# Patient Record
Sex: Male | Born: 1970 | Race: White | Hispanic: No | Marital: Single | Smoking: Never smoker
Health system: Southern US, Community
[De-identification: ages and names within clinical notes are randomized; demographics above are authoritative.]

## PROBLEM LIST (undated history)

## (undated) DIAGNOSIS — F4310 Post-traumatic stress disorder, unspecified: Secondary | ICD-10-CM

## (undated) DIAGNOSIS — F419 Anxiety disorder, unspecified: Secondary | ICD-10-CM

## (undated) DIAGNOSIS — I10 Essential (primary) hypertension: Secondary | ICD-10-CM

## (undated) DIAGNOSIS — E785 Hyperlipidemia, unspecified: Secondary | ICD-10-CM

## (undated) HISTORY — PX: ANKLE SURGERY: SHX546

## (undated) HISTORY — PX: SHOULDER SURGERY: SHX246

---

## 2014-05-12 ENCOUNTER — Emergency Department (HOSPITAL_COMMUNITY): Payer: No Typology Code available for payment source

## 2014-05-12 ENCOUNTER — Emergency Department (HOSPITAL_COMMUNITY)
Admission: EM | Admit: 2014-05-12 | Discharge: 2014-05-12 | Disposition: A | Discharge status: Home / Self Care | Payer: No Typology Code available for payment source | Attending: Emergency Medicine | Admitting: Emergency Medicine

## 2014-05-12 ENCOUNTER — Encounter (HOSPITAL_COMMUNITY): Payer: Self-pay | Admitting: Emergency Medicine

## 2014-05-12 DIAGNOSIS — T148XXA Other injury of unspecified body region, initial encounter: Secondary | ICD-10-CM

## 2014-05-12 DIAGNOSIS — Y92410 Unspecified street and highway as the place of occurrence of the external cause: Secondary | ICD-10-CM | POA: Insufficient documentation

## 2014-05-12 DIAGNOSIS — S40011A Contusion of right shoulder, initial encounter: Secondary | ICD-10-CM

## 2014-05-12 DIAGNOSIS — IMO0002 Reserved for concepts with insufficient information to code with codable children: Secondary | ICD-10-CM | POA: Insufficient documentation

## 2014-05-12 DIAGNOSIS — I10 Essential (primary) hypertension: Secondary | ICD-10-CM | POA: Insufficient documentation

## 2014-05-12 DIAGNOSIS — S40019A Contusion of unspecified shoulder, initial encounter: Secondary | ICD-10-CM | POA: Insufficient documentation

## 2014-05-12 DIAGNOSIS — Y9389 Activity, other specified: Secondary | ICD-10-CM | POA: Insufficient documentation

## 2014-05-12 HISTORY — DX: Essential (primary) hypertension: I10

## 2014-05-12 HISTORY — DX: Hyperlipidemia, unspecified: E78.5

## 2014-05-12 MED ORDER — HYDROCODONE-ACETAMINOPHEN 5-325 MG PO TABS
1.0000 | ORAL_TABLET | Freq: Once | ORAL | Status: AC
Start: 2014-05-12 — End: 2014-05-12
  Administered 2014-05-12: 1 via ORAL
  Filled 2014-05-12: qty 1

## 2014-05-12 MED ORDER — NAPROXEN 500 MG PO TABS: 500.0000 mg | ORAL_TABLET | Freq: Two times a day (BID) | ORAL | Status: DC
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
## 2014-05-12 NOTE — ED Notes (Signed)
Bed: [facility] Expected date:  Expected time:  Means of arrival:  Comments: EMS 
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
## 2014-05-12 NOTE — ED Notes (Signed)
PT has a ride home.  

## 2014-05-12 NOTE — ED Provider Notes (Signed)
Medical screening examination/treatment/procedure(s) were performed by non-physician practitioner and as supervising physician I was immediately available for consultation/collaboration.   EKG Interpretation None        Whitney Plunkett, MD 05/12/14 2329 

## 2014-05-12 NOTE — ED Notes (Signed)
Pt ambulatory to exam room with steady gait.  
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
## 2014-07-28 ENCOUNTER — Ambulatory Visit: Payer: Non-veteran care | Attending: Physician Assistant | Admitting: Physical Therapy

## 2014-07-28 DIAGNOSIS — M25619 Stiffness of unspecified shoulder, not elsewhere classified: Secondary | ICD-10-CM | POA: Insufficient documentation

## 2014-07-28 DIAGNOSIS — M25519 Pain in unspecified shoulder: Secondary | ICD-10-CM | POA: Diagnosis not present

## 2014-07-28 DIAGNOSIS — IMO0001 Reserved for inherently not codable concepts without codable children: Secondary | ICD-10-CM | POA: Diagnosis present

## 2014-08-10 ENCOUNTER — Ambulatory Visit: Payer: Non-veteran care | Admitting: Rehabilitation

## 2014-08-10 DIAGNOSIS — IMO0001 Reserved for inherently not codable concepts without codable children: Secondary | ICD-10-CM | POA: Diagnosis not present

## 2014-08-12 ENCOUNTER — Ambulatory Visit: Payer: Non-veteran care | Admitting: Physical Therapy

## 2014-08-12 DIAGNOSIS — IMO0001 Reserved for inherently not codable concepts without codable children: Secondary | ICD-10-CM | POA: Diagnosis not present

## 2014-08-17 ENCOUNTER — Ambulatory Visit: Payer: Non-veteran care | Admitting: Rehabilitation

## 2014-08-17 DIAGNOSIS — IMO0001 Reserved for inherently not codable concepts without codable children: Secondary | ICD-10-CM | POA: Diagnosis not present

## 2014-08-19 ENCOUNTER — Ambulatory Visit: Payer: Non-veteran care | Attending: Physician Assistant | Admitting: Physical Therapy

## 2014-08-19 DIAGNOSIS — M25511 Pain in right shoulder: Secondary | ICD-10-CM | POA: Insufficient documentation

## 2014-08-19 DIAGNOSIS — M25611 Stiffness of right shoulder, not elsewhere classified: Secondary | ICD-10-CM | POA: Insufficient documentation

## 2014-08-19 DIAGNOSIS — Z5189 Encounter for other specified aftercare: Secondary | ICD-10-CM | POA: Insufficient documentation

## 2014-08-24 ENCOUNTER — Ambulatory Visit: Payer: Non-veteran care | Admitting: Physical Therapy

## 2014-08-24 DIAGNOSIS — Z5189 Encounter for other specified aftercare: Secondary | ICD-10-CM | POA: Diagnosis not present

## 2014-08-26 ENCOUNTER — Encounter: Payer: Non-veteran care | Admitting: Physical Therapy

## 2014-08-27 ENCOUNTER — Encounter: Payer: Non-veteran care | Admitting: Physical Therapy

## 2014-08-31 ENCOUNTER — Ambulatory Visit: Payer: Non-veteran care | Admitting: Rehabilitation

## 2014-08-31 DIAGNOSIS — Z5189 Encounter for other specified aftercare: Secondary | ICD-10-CM | POA: Diagnosis not present

## 2014-09-07 ENCOUNTER — Ambulatory Visit: Payer: Non-veteran care | Admitting: Physical Therapy

## 2014-09-07 DIAGNOSIS — Z5189 Encounter for other specified aftercare: Secondary | ICD-10-CM | POA: Diagnosis not present

## 2014-09-15 ENCOUNTER — Ambulatory Visit: Payer: Non-veteran care

## 2014-09-15 DIAGNOSIS — Z5189 Encounter for other specified aftercare: Secondary | ICD-10-CM | POA: Diagnosis not present

## 2014-09-23 ENCOUNTER — Encounter: Payer: Non-veteran care | Admitting: Physical Therapy

## 2014-09-30 ENCOUNTER — Encounter: Payer: Non-veteran care | Admitting: Physical Therapy

## 2016-09-20 ENCOUNTER — Encounter (HOSPITAL_COMMUNITY): Payer: Self-pay | Admitting: Pharmacy Technician

## 2016-09-20 ENCOUNTER — Emergency Department (HOSPITAL_COMMUNITY): Payer: Non-veteran care

## 2016-09-20 ENCOUNTER — Emergency Department (HOSPITAL_COMMUNITY)
Admission: EM | Admit: 2016-09-20 | Discharge: 2016-09-20 | Disposition: A | Discharge status: Home / Self Care | Payer: Non-veteran care | Attending: Emergency Medicine | Admitting: Emergency Medicine

## 2016-09-20 DIAGNOSIS — I10 Essential (primary) hypertension: Secondary | ICD-10-CM | POA: Insufficient documentation

## 2016-09-20 DIAGNOSIS — Z79899 Other long term (current) drug therapy: Secondary | ICD-10-CM | POA: Diagnosis not present

## 2016-09-20 DIAGNOSIS — R519 Headache, unspecified: Secondary | ICD-10-CM

## 2016-09-20 DIAGNOSIS — R51 Headache: Secondary | ICD-10-CM | POA: Insufficient documentation

## 2016-09-20 HISTORY — DX: Anxiety disorder, unspecified: F41.9

## 2016-09-20 HISTORY — DX: Post-traumatic stress disorder, unspecified: F43.10

## 2016-09-20 LAB — COMPREHENSIVE METABOLIC PANEL
ALBUMIN: 4.2 g/dL (ref 3.5–5.0)
ALT: 36 U/L (ref 17–63)
ANION GAP: 6 (ref 5–15)
AST: 23 U/L (ref 15–41)
Alkaline Phosphatase: 77 U/L (ref 38–126)
BILIRUBIN TOTAL: 0.9 mg/dL (ref 0.3–1.2)
BUN: 16 mg/dL (ref 6–20)
CO2: 29 mmol/L (ref 22–32)
Calcium: 9.4 mg/dL (ref 8.9–10.3)
Chloride: 99 mmol/L — ABNORMAL LOW (ref 101–111)
Creatinine, Ser: 1.35 mg/dL — ABNORMAL HIGH (ref 0.61–1.24)
GFR calc Af Amer: >60 mL/min (ref >60)
Glucose, Bld: 112 mg/dL — ABNORMAL HIGH (ref 65–99)
POTASSIUM: 4.2 mmol/L (ref 3.5–5.1)
Sodium: 134 mmol/L — ABNORMAL LOW (ref 135–145)
TOTAL PROTEIN: 7.6 g/dL (ref 6.5–8.1)

## 2016-09-20 LAB — CBC WITH DIFFERENTIAL/PLATELET
BASOS PCT: 0 %
Basophils Absolute: 0 10*3/uL (ref 0.0–0.1)
Eosinophils Absolute: 0.1 10*3/uL (ref 0.0–0.7)
Eosinophils Relative: 1 %
HEMATOCRIT: 48.6 % (ref 39.0–52.0)
Hemoglobin: 16.6 g/dL (ref 13.0–17.0)
Lymphocytes Relative: 7 %
Lymphs Abs: 0.9 10*3/uL (ref 0.7–4.0)
MCH: 30.6 pg (ref 26.0–34.0)
MCHC: 34.2 g/dL (ref 30.0–36.0)
MCV: 89.5 fL (ref 78.0–100.0)
MONO ABS: 1 10*3/uL (ref 0.1–1.0)
MONOS PCT: 8 %
NEUTROS ABS: 9.8 10*3/uL — AB (ref 1.7–7.7)
Neutrophils Relative %: 84 %
Platelets: 259 10*3/uL (ref 150–400)
RBC: 5.43 MIL/uL (ref 4.22–5.81)
RDW: 13.1 % (ref 11.5–15.5)
WBC: 11.8 10*3/uL — ABNORMAL HIGH (ref 4.0–10.5)

## 2016-09-20 LAB — I-STAT TROPONIN, ED: TROPONIN I, POC: 0 ng/mL (ref 0.00–0.08)

## 2016-09-20 MED ORDER — DIPHENHYDRAMINE HCL 50 MG/ML IJ SOLN
25.0000 mg | Freq: Once | INTRAMUSCULAR | Status: AC
  Administered 2016-09-20: 25 mg via INTRAVENOUS
  Filled 2016-09-20: qty 1

## 2016-09-20 MED ORDER — SODIUM CHLORIDE 0.9 % IV BOLUS (SEPSIS)
1000.0000 mL | Freq: Once | INTRAVENOUS | Status: AC
Start: 2016-09-20 — End: 2016-09-20
  Administered 2016-09-20: 1000 mL via INTRAVENOUS

## 2016-09-20 MED ORDER — KETOROLAC TROMETHAMINE 30 MG/ML IJ SOLN
30.0000 mg | Freq: Once | INTRAMUSCULAR | Status: AC
  Administered 2016-09-20: 30 mg via INTRAVENOUS
  Filled 2016-09-20: qty 1

## 2016-09-20 MED ORDER — METOCLOPRAMIDE HCL 5 MG/ML IJ SOLN
10.0000 mg | Freq: Once | INTRAMUSCULAR | Status: AC
  Administered 2016-09-20: 10 mg via INTRAVENOUS
  Filled 2016-09-20: qty 2
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
## 2016-09-20 NOTE — ED Triage Notes (Signed)
Per EMS pt C/O headache since approx 0800 today with sensitivity to light, nausea, dizziness and near syncope. Pt with no hx of migraines. Hx includes hypertension, hyperlipidemia, PTSD and anxiety.

## 2016-09-20 NOTE — ED Notes (Signed)
Patient transported to CT 

## 2016-09-20 NOTE — Discharge Instructions (Signed)
Drink plenty of fluids. Avoid stressors. Your creatinine slightly high today at 1.35. Otherwise no significant lab abnormalities. CT scan is normal. Take ibuprofen or tylenol for pain. Follow up with family doctor.

## 2017-11-27 ENCOUNTER — Encounter: Payer: Self-pay | Admitting: Podiatry

## 2017-11-27 ENCOUNTER — Ambulatory Visit (INDEPENDENT_AMBULATORY_CARE_PROVIDER_SITE_OTHER): Payer: Non-veteran care | Admitting: Podiatry

## 2017-11-27 VITALS — BP 135/101 | HR 93 | Ht 71.0 in | Wt 342.0 lb

## 2017-11-27 DIAGNOSIS — L600 Ingrowing nail: Secondary | ICD-10-CM

## 2017-11-27 NOTE — Patient Instructions (Signed)
Place 1/4 cup of epsom salts in a quart of warm tap water.  Submerge your foot or feet in the solution and soak for 20 minutes.  This soak should be done twice a day.  Next, remove your foot or feet from solution, blot dry the affected area. Apply ointment and cover if instructed by your doctor.   IF YOUR SKIN BECOMES IRRITATED WHILE USING THESE INSTRUCTIONS, IT IS OKAY TO SWITCH TO  WHITE VINEGAR AND WATER.  As another alternative soak, you may use antibacterial soap and water.  Monitor for any signs/symptoms of infection. Call the office immediately if any occur or go directly to the emergency room. Call with any questions/concerns.  Ingrown Toenail An ingrown toenail occurs when the corner or sides of your toenail grow into the surrounding skin. The big toe is most commonly affected, but it can happen to any of your toes. If your ingrown toenail is not treated, you will be at risk for infection. What are the causes? This condition may be caused by:  Wearing shoes that are too small or tight.  Injury or trauma, such as stubbing your toe or having your toe stepped on.  Improper cutting or care of your toenails.  Being born with (congenital) nail or foot abnormalities, such as having a nail that is too big for your toe.  What increases the risk? Risk factors for an ingrown toenail include:  Age. Your nails tend to thicken as you get older, so ingrown nails are more common in older people.  Diabetes.  Cutting your toenails incorrectly.  Blood circulation problems.  What are the signs or symptoms? Symptoms may include:  Pain, soreness, or tenderness.  Redness.  Swelling.  Hardening of the skin surrounding the toe.  Your ingrown toenail may be infected if there is fluid, pus, or drainage. How is this diagnosed? An ingrown toenail may be diagnosed by medical history and physical exam. If your toenail is infected, your health care provider may test a sample of the  drainage. How is this treated? Treatment depends on the severity of your ingrown toenail. Some ingrown toenails may be treated at home. More severe or infected ingrown toenails may require surgery to remove all or part of the nail. Infected ingrown toenails may also be treated with antibiotic medicines. Follow these instructions at home:  If you were prescribed an antibiotic medicine, finish all of it even if you start to feel better.  Soak your foot in warm soapy water for 20 minutes, 3 times per day or as directed by your health care provider.  Carefully lift the edge of the nail away from the sore skin by wedging a small piece of cotton under the corner of the nail. This may help with the pain. Be careful not to cause more injury to the area.  Wear shoes that fit well. If your ingrown toenail is causing you pain, try wearing sandals, if possible.  Trim your toenails regularly and carefully. Do not cut them in a curved shape. Cut your toenails straight across. This prevents injury to the skin at the corners of the toenail.  Keep your feet clean and dry.  If you are having trouble walking and are given crutches by your health care provider, use them as directed.  Do not pick at your toenail or try to remove it yourself.  Take medicines only as directed by your health care provider.  Keep all follow-up visits as directed by your health care provider. This   is important. Contact a health care provider if:  Your symptoms do not improve with treatment. Get help right away if:  You have red streaks that start at your foot and go up your leg.  You have a fever.  You have increased redness, swelling, or pain.  You have fluid, blood, or pus coming from your toenail. This information is not intended to replace advice given to you by your health care provider. Make sure you discuss any questions you have with your health care provider. Document Released: 11/02/2000 Document Revised:  04/06/2016 Document Reviewed: 09/29/2014 [employer] Interactive Patient Education  2018 [employer]  

## 2017-11-27 NOTE — Progress Notes (Signed)
   Subjective:    Patient ID: Brendan Barton, male    DOB: 04-19-1971, 47 y.o.   MRN: [id number]  HPI  Chief Complaint  Patient presents with  . Ingrown Toenail    left great toe  . Plantar Warts    right bottom of foot      Review of Systems  All other systems reviewed and are negative.      Objective:   Physical Exam        Assessment & Plan:

## 2017-11-29 ENCOUNTER — Telehealth: Payer: Self-pay | Admitting: Podiatry

## 2017-11-29 NOTE — Telephone Encounter (Signed)
This is Kathlene NovemberMike calling from the [facility]. I'm calling to get the office visit note from date of service 27 November 2017. Please fax to my attention at [phone].

## 2017-12-01 NOTE — Progress Notes (Signed)
   Subjective: Patient presents today for evaluation of pain to the medial border of the left great toe. Patient is concerned for possible ingrown nail. Patient states that the pain has been present for a few weeks now.  He also complains of a wart to the plantar aspect of the right foot.  Patient presents today for further treatment and evaluation.   Past Medical History:  Diagnosis Date  . Anxiety   . Hyperlipidemia   . Hypertension   . PTSD (post-traumatic stress disorder)     Objective:  General: Well developed, nourished, in no acute distress, alert and oriented x3   Dermatology: Skin is warm, dry and supple bilateral.  Medial border of the left great toe appears to be erythematous with evidence of an ingrowing nail. Pain on palpation noted to the border of the nail fold. The remaining nails appear unremarkable at this time. There are no open sores, lesions.  Vascular: Dorsalis Pedis artery and Posterior Tibial artery pedal pulses palpable. No lower extremity edema noted.   Neruologic: Grossly intact via light touch bilateral.  Musculoskeletal: Muscular strength within normal limits in all groups bilateral. Normal range of motion noted to all pedal and ankle joints.   Assesement: #1 Paronychia with ingrowing nail medial border of the left great toe #2 Pain in toe #3 Incurvated nail  Plan of Care:  1. Patient evaluated.  2. Discussed treatment alternatives and plan of care. Explained nail avulsion procedure and post procedure course to patient. 3. Patient opted for permanent partial nail avulsion.  4. Prior to procedure, local anesthesia infiltration utilized using 3 ml of a 50:50 mixture of 2% plain lidocaine and 0.5% plain marcaine in a normal hallux block fashion and a betadine prep performed.  5. Partial permanent nail avulsion with chemical matrixectomy performed using 3x30sec applications of phenol followed by alcohol flush.  6. Light dressing applied. 7. Return to  clinic in 2 weeks.  May need to address wart on right foot at that time.  Felecia ShellingBrent M. Gloriana Piltz, DPM [facility]  Dr. Felecia ShellingBrent M. Orine Goga, DPM    [address]                                        [place], [place] [address]                Office [phone](336) [phone]  Fax [phone]([phone]

## 2017-12-16 ENCOUNTER — Ambulatory Visit: Payer: Non-veteran care | Admitting: Podiatry

## 2017-12-18 ENCOUNTER — Ambulatory Visit (INDEPENDENT_AMBULATORY_CARE_PROVIDER_SITE_OTHER): Payer: Non-veteran care | Admitting: Podiatry

## 2017-12-18 ENCOUNTER — Encounter: Payer: Self-pay | Admitting: Podiatry

## 2017-12-18 DIAGNOSIS — B070 Plantar wart: Secondary | ICD-10-CM | POA: Diagnosis not present

## 2017-12-18 DIAGNOSIS — L600 Ingrowing nail: Secondary | ICD-10-CM | POA: Diagnosis not present

## 2017-12-22 NOTE — Progress Notes (Signed)
   Subjective: 47 year old male presenting today for follow up evaluation status post a permanent partial nail avulsion procedure of the medial border of the left great toe on 11/27/17. He states the area is doing well and has no complaints concerning it at this time.  He does report a plantar wart to the right foot that he is concerned about. He reports associated soreness of the area. He has not done anything to treat the area. Bearing weight and applying pressure increases the pain while resting the foot helps alleviate it. Patient denies trauma. Patient is here for further evaluation and treatment.   Past Medical History:  Diagnosis Date  . Anxiety   . Hyperlipidemia   . Hypertension   . PTSD (post-traumatic stress disorder)     Objective: Physical Exam General: The patient is alert and oriented x3 in no acute distress.  Dermatology: Hyperkeratotic skin lesion noted to the plantar aspect of the right foot approximately 1 cm in diameter. Pinpoint bleeding noted upon debridement. Nail and respective nail fold appears to be healing appropriately. Open wound to the associated nail fold with a granular wound base and moderate amount of fibrotic tissue. Minimal drainage noted. Mild erythema around the periungual region likely due to phenol chemical matricectomy. Skin is warm, dry and supple bilateral lower extremities. Negative for open lesions or macerations.  Vascular: Palpable pedal pulses bilaterally. No edema or erythema noted. Capillary refill within normal limits.  Neurological: Epicritic and protective threshold grossly intact bilaterally.   Musculoskeletal Exam: Pain on palpation to the note skin lesion.  Range of motion within normal limits to all pedal and ankle joints bilateral. Muscle strength 5/5 in all groups bilateral.   Assessment: #1 plantar wart right foot #2 pain in right foot #3 postop permanent partial nail avulsion medial border left great toe #4 open wound  periungual nail fold of respective digit.    Plan of Care:  #1 Patient was evaluated. #2 Excisional debridement of the plantar wart lesion was performed using a chisel blade. Cantharone was applied and the lesion was dressed with a dry sterile dressing. #3 Debridement of open wound was performed to the periungual border of the respective toe using a currette. Antibiotic ointment and Band-Aid was applied. #4 Return to clinic in 2 weeks.   Felecia ShellingBrent M. Minnie Shi, DPM [facility]  Dr. Felecia ShellingBrent M. Skarlet Lyons, DPM    [address]                                        [place], [place] [address]                Office [phone]([phone]  Fax [phone](336) [phone]

## 2018-01-06 ENCOUNTER — Ambulatory Visit (INDEPENDENT_AMBULATORY_CARE_PROVIDER_SITE_OTHER): Payer: Non-veteran care | Admitting: Podiatry

## 2018-01-06 ENCOUNTER — Encounter: Payer: Self-pay | Admitting: Podiatry

## 2018-01-06 DIAGNOSIS — B070 Plantar wart: Secondary | ICD-10-CM

## 2018-01-08 NOTE — Progress Notes (Signed)
   Subjective: Patient presents today for follow-up evaluation of a plantar wart to the right foot. He states the area has blistered but is no longer sore. Patient presents today for follow-up treatment and evaluation.   Past Medical History:  Diagnosis Date  . Anxiety   . Hyperlipidemia   . Hypertension   . PTSD (post-traumatic stress disorder)      Objective: Physical Exam General: The patient is alert and oriented x3 in no acute distress.  Dermatology: Hyperkeratotic skin lesion noted to the plantar aspect of the right foot approximately 1 cm in diameter. Pinpoint bleeding noted upon debridement. Skin is warm, dry and supple bilateral lower extremities. Negative for open lesions or macerations.  Vascular: Palpable pedal pulses bilaterally. No edema or erythema noted. Capillary refill within normal limits.  Neurological: Epicritic and protective threshold grossly intact bilaterally.   Musculoskeletal Exam: Pain on palpation to the note skin lesion.  Range of motion within normal limits to all pedal and ankle joints bilateral. Muscle strength 5/5 in all groups bilateral.   Assessment: #1 plantar wart right foot - improved   Plan of Care:  #1 Patient was evaluated. #2 Excisional debridement of the plantar wart lesion was performed using a chisel blade. Salicylic acid was applied and the lesion was dressed with a dry sterile dressing. #3 return to clinic when necessary.   Felecia ShellingBrent M. Trevin Gartrell, DPM [facility]  Dr. Felecia ShellingBrent M. Corban Kistler, DPM    [address]                                        [place], [place] [address]                Office [phone]  Fax [phone]) [phone]

## 2018-06-25 ENCOUNTER — Ambulatory Visit (INDEPENDENT_AMBULATORY_CARE_PROVIDER_SITE_OTHER): Payer: No Typology Code available for payment source | Admitting: Podiatry

## 2018-06-25 ENCOUNTER — Other Ambulatory Visit: Payer: Self-pay | Admitting: Podiatry

## 2018-06-25 ENCOUNTER — Ambulatory Visit (INDEPENDENT_AMBULATORY_CARE_PROVIDER_SITE_OTHER): Payer: No Typology Code available for payment source

## 2018-06-25 DIAGNOSIS — M779 Enthesopathy, unspecified: Principal | ICD-10-CM

## 2018-06-25 DIAGNOSIS — B070 Plantar wart: Secondary | ICD-10-CM

## 2018-06-25 DIAGNOSIS — M722 Plantar fascial fibromatosis: Secondary | ICD-10-CM

## 2018-06-25 DIAGNOSIS — L600 Ingrowing nail: Secondary | ICD-10-CM | POA: Diagnosis not present

## 2018-06-25 DIAGNOSIS — M778 Other enthesopathies, not elsewhere classified: Secondary | ICD-10-CM

## 2018-06-25 NOTE — Patient Instructions (Signed)
Place 1/4 cup of epsom salts in a quart of warm tap water.  Submerge your foot or feet in the solution and soak for 20 minutes.  This soak should be done twice a day.  Next, remove your foot or feet from solution, blot dry the affected area. Apply ointment and cover if instructed by your doctor.   IF YOUR SKIN BECOMES IRRITATED WHILE USING THESE INSTRUCTIONS, IT IS OKAY TO SWITCH TO  WHITE VINEGAR AND WATER.  As another alternative soak, you may use antibacterial soap and water.  Monitor for any signs/symptoms of infection. Call the office immediately if any occur or go directly to the emergency room. Call with any questions/concerns.   Long Term Care Instructions-Post Nail Surgery  You have had your ingrown toenail and root treated with a chemical.  This chemical causes a burn that will drain and ooze like a blister.  This can drain for 6-8 weeks or longer.  It is important to keep this area clean, covered, and follow the soaking instructions dispensed at the time of your surgery.  This area will eventually dry and form a scab.  Once the scab forms you no longer need to soak or apply a dressing.  If at any time you experience an increase in pain, redness, swelling, or drainage, you should contact the office as soon as possible.  Wart Surgery-Directions for Home Care  You will need: Dial antibacterial hand soap, sterile gauze,  Band-aids  1. Keep the original bandage on until the following morning.  Bathe or shower with the bandage on allowing it to soak, so that when removed it won't stick to the wound. 2. After showering or bathing, remove the old bandage and cleanse the area with Dial soap and water.  Place a few drops of Dial soap and water on a piece of guaze and gently scrub the area.  Dry with a clean piece of gauze. 3. Apply antibiotic cream (polysporin, triple antibiotic or similar) to the area and place a clean square gauze bandage over and cover with a band-aid. 4. In the evening, add a  few drops of Dial soap to a basin of lukewarm water and soak your foot for 15 minutes.  After soaking, follow the instructions above for cleaning the area. 5. Continue cleansing the area as described above two times a day, applying sterile gauze dressings until the doctor informs you that it is not needed. 6. The charge for the surgical procedure includes the follow-up visits after surgery.  Additional treatments (if necessary) are not included. 7. The time required to heal the surgical site will depend upon the size and location of the wart.  Lesions under bony prominences heal slower.  The average healing time is 2 to 4 weeks. 8. Take over the counter Ibuprofen or Tylenol as needed should you experience any discomfort 9. If you do experience discomfort after surgery, keep the foot elevated and apply an ice pack over your ankle, 30 minutes on, 30 minutes off each hour for the rest of the day. 10. If you have any questions , please do not hesitate to contact the office.

## 2018-06-29 NOTE — Progress Notes (Signed)
   Subjective: Patient presents today for evaluation of pain to the medial border of the right hallux that began a few weeks ago. Patient is concerned for possible ingrown nail. Wearing shoes and applying pressure to the toe increases the pain. He has not done anything for treatment.  He also reports a flare up of bilateral plantar fasciitis that began about one months ago. He states he is interested in orthotics. He has not done anything for treatment at this time. Walking and bearing weight for long periods of time increases the pain.  Lastly, patient complains of a recurrent plantar wart of the right foot. He has not done anything for treatment. He states bearing weight on the area increases the pain. Patient presents today for further treatment and evaluation.  Past Medical History:  Diagnosis Date  . Anxiety   . Hyperlipidemia   . Hypertension   . PTSD (post-traumatic stress disorder)     Objective:  General: Well developed, nourished, in no acute distress, alert and oriented x3   Dermatology: Skin is warm, dry and supple bilateral. Medial border of the right hallux appears to be erythematous with evidence of an ingrowing nail. Pain on palpation noted to the border of the nail fold. Hyperkeratotic skin lesion noted to the plantar aspect of the right foot approximately 1 cm in diameter. Pinpoint bleeding noted upon debridement. The remaining nails appear unremarkable at this time.   Vascular: Dorsalis Pedis artery and Posterior Tibial artery pedal pulses palpable. No lower extremity edema noted.   Neruologic: Grossly intact via light touch bilateral.  Musculoskeletal: Tenderness to palpation to the plantar aspect of the bilateral heels along the plantar fascia. Muscular strength within normal limits in all groups bilateral. Normal range of motion noted to all pedal and ankle joints.   Radiographic exam: Normal osseous mineralization. Joint spaces preserved. No fracture/dislocation/boney  destruction. No other soft tissue abnormalities or radiopaque foreign bodies.   Assesement: #1 Paronychia with ingrowing nail medial border right hallux  #2 Pain in toe #3 Incurvated nail #4 plantar wart right  #5 plantar fasciitis bilateral   Plan of Care:  1. Patient evaluated. X-Rays reviewed.  2. Discussed treatment alternatives and plan of care. Explained nail avulsion procedure and post procedure course to patient. 3. Patient opted for permanent partial nail avulsion.  4. Prior to procedure, local anesthesia infiltration utilized using 3 ml of a 50:50 mixture of 2% plain lidocaine and 0.5% plain marcaine in a normal hallux block fashion and a betadine prep performed.  5. Partial permanent nail avulsion with chemical matrixectomy performed using 3x30sec applications of phenol followed by alcohol flush.  6. Light dressing applied. 7. Excisional debridement of the plantar wart lesion was performed using a chisel blade. Cantharone was applied and the lesion was dressed with a dry sterile dressing. 8. Prescription for custom molded orth[facility]  9. Return to clinic in 2 weeks.   Felecia ShellingBrent M. Rileyann Florance, DPM [facility]  Dr. Felecia ShellingBrent M. Chun Sellen, DPM    [address]                                        [place], [place] [address]                Office [phone]) [phone]  Fax [phone]([phone]

## 2018-06-30 ENCOUNTER — Telehealth: Payer: Self-pay | Admitting: Podiatry

## 2018-06-30 NOTE — Telephone Encounter (Signed)
Notified pt that office visit note and rx [facility] [place]..

## 2018-07-09 ENCOUNTER — Ambulatory Visit: Payer: Non-veteran care | Admitting: Podiatry

## 2018-07-09 ENCOUNTER — Ambulatory Visit (INDEPENDENT_AMBULATORY_CARE_PROVIDER_SITE_OTHER): Payer: Non-veteran care | Admitting: Podiatry

## 2018-07-09 DIAGNOSIS — M722 Plantar fascial fibromatosis: Secondary | ICD-10-CM

## 2018-07-09 DIAGNOSIS — B070 Plantar wart: Secondary | ICD-10-CM

## 2018-07-09 DIAGNOSIS — L600 Ingrowing nail: Secondary | ICD-10-CM | POA: Diagnosis not present

## 2018-07-13 NOTE — Progress Notes (Signed)
   Subjective: Patient presents today 2 weeks post ingrown nail permanent nail avulsion procedure of the medial border of the right hallux. Patient states that the toe and nail fold is feeling much better. He is also here for follow up evaluation of bilateral plantar fasciitis and a plantar wart of the right foot. He states both areas have improved. He denies any modifying factors. Patient is here for further evaluation and treatment.   Past Medical History:  Diagnosis Date  . Anxiety   . Hyperlipidemia   . Hypertension   . PTSD (post-traumatic stress disorder)     Objective: Skin is warm, dry and supple. Nail and respective nail fold appears to be healing appropriately. Open wound to the associated nail fold with a granular wound base and moderate amount of fibrotic tissue. Minimal drainage noted. Mild erythema around the periungual region likely due to phenol chemical matricectomy. Hyperkeratotic skin lesion noted to the plantar aspect of the right foot approximately 1 cm in diameter. Pinpoint bleeding noted upon debridement.  Tenderness to palpation to the plantar aspect of the bilateral heels along the plantar fascia.  Assessment: #1 postop permanent partial nail avulsion medial border of the right hallux  #2 open wound periungual nail fold of respective digit.  #3 plantar wart right foot - improved #4 plantar fasciitis bilateral   Plan of care: #1 patient was evaluated  #2 debridement of open wound was performed to the periungual border of the respective toe using a currette. Antibiotic ointment and Band-Aid was applied. #3 Excisional debridement of the plantar wart lesion was performed using a chisel blade. Cantharone was applied and the lesion was dressed with a dry sterile dressing #4 Patient is getting [facility].  #5 patient is to return to clinic in 2 weeks.    Felecia ShellingBrent M. Abriella Filkins, DPM [facility]  Dr. Felecia ShellingBrent M. Mayreli Alden, DPM      [address]                                        [place], [place] [address]                Office [phone](336) [phone]  Fax [phone](336) [phone]

## 2018-07-28 ENCOUNTER — Ambulatory Visit (INDEPENDENT_AMBULATORY_CARE_PROVIDER_SITE_OTHER): Payer: Non-veteran care | Admitting: Podiatry

## 2018-07-28 ENCOUNTER — Encounter: Payer: Self-pay | Admitting: Podiatry

## 2018-07-28 DIAGNOSIS — B070 Plantar wart: Secondary | ICD-10-CM

## 2018-07-30 NOTE — Progress Notes (Signed)
   Subjective: 47 year old male presenting today for follow up evaluation of a plantar wart of the right foot. He states the area looks better. He denies pain or modifying factors. Patient is here for further evaluation and treatment.   Past Medical History:  Diagnosis Date  . Anxiety   . Hyperlipidemia   . Hypertension   . PTSD (post-traumatic stress disorder)     Objective: Physical Exam General: The patient is alert and oriented x3 in no acute distress.  Dermatology: Hyperkeratotic skin lesion noted to the plantar aspect of the right foot approximately 1 cm in diameter. Pinpoint bleeding noted upon debridement. Skin is warm, dry and supple bilateral lower extremities. Negative for open lesions or macerations.  Vascular: Palpable pedal pulses bilaterally. No edema or erythema noted. Capillary refill within normal limits.  Neurological: Epicritic and protective threshold grossly intact bilaterally.   Musculoskeletal Exam: Pain on palpation to the note skin lesion.  Range of motion within normal limits to all pedal and ankle joints bilateral. Muscle strength 5/5 in all groups bilateral.   Assessment: #1 plantar wart right foot #2 pain in right foot   Plan of Care:  #1 Patient was evaluated. #2 Excisional debridement of the plantar wart lesion was performed using a chisel blade. Salinocaine was applied and the lesion was dressed with a dry sterile dressing. #3 patient is to return to clinic as needed.   Felecia Shelling, DPM [facility]  Dr. Felecia Shelling, DPM    [address]                                        [place], [place] [address]                Office [phone]  Fax [phone]

## 2018-08-25 IMAGING — CT CT HEAD W/O CM
3 of 4 series · 14 of 47 positions shown, 16 images · non-contrast
Comparison: None.

CLINICAL DATA: Headache.  Sensitivity to light.

EXAM:
CT HEAD WITHOUT CONTRAST
TECHNIQUE: Contiguous axial images were obtained from the base of the skull
through the vertex without intravenous contrast.

[Series 2: head w/o · axial · non-contrast · 0.49mm/px · z∈[+1700,+1835]mm · 8 of 33 slices shown, 10 images]
[im 3/33  brain]
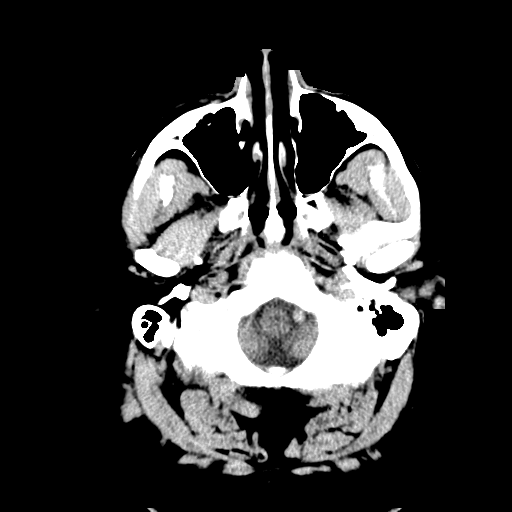
[im 3/33  bone]
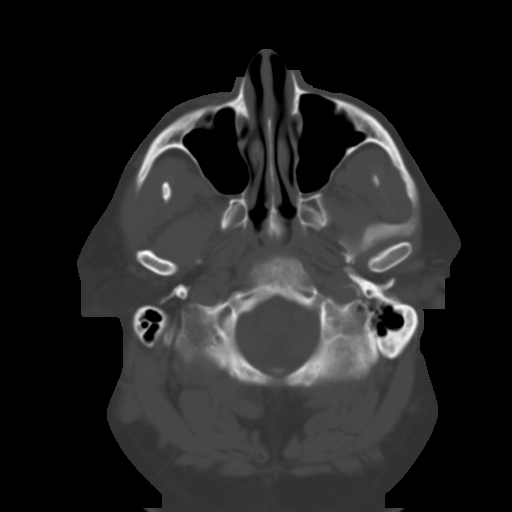
[im 7/33  brain]
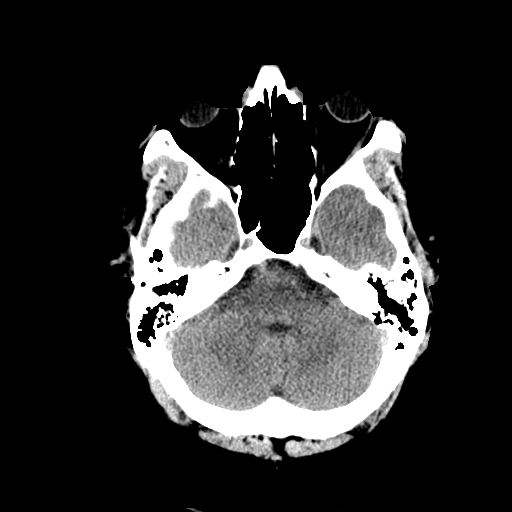
[im 12/33  brain]
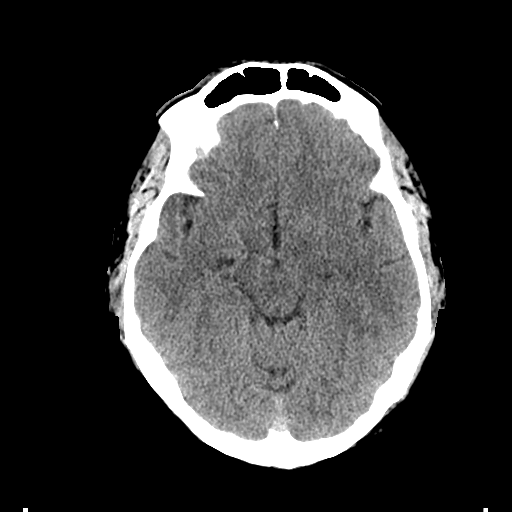
[im 14/33  brain]
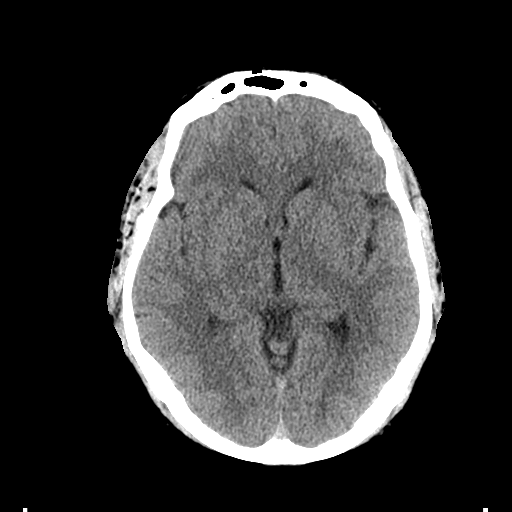
[im 19/33  brain]
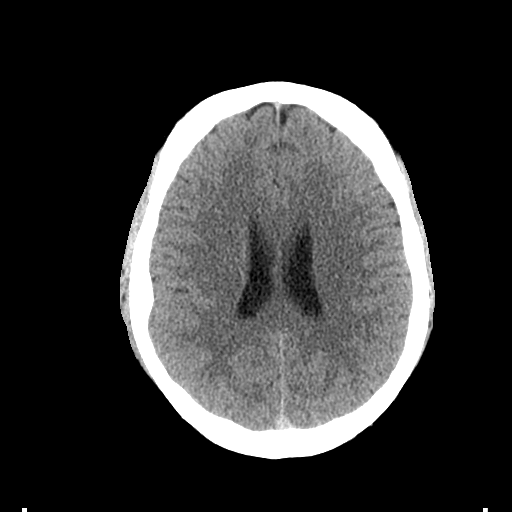
[im 19/33  bone]
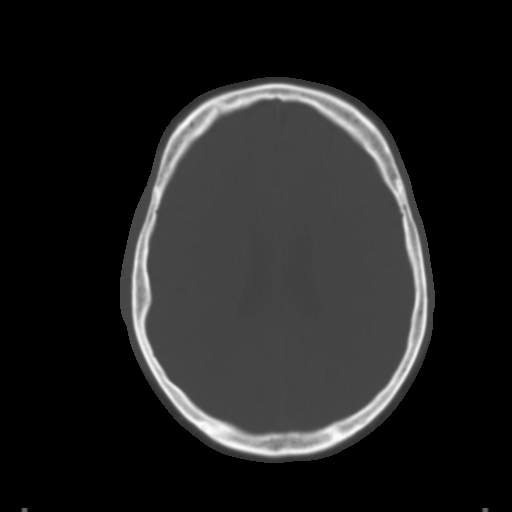
[im 21/33  brain]
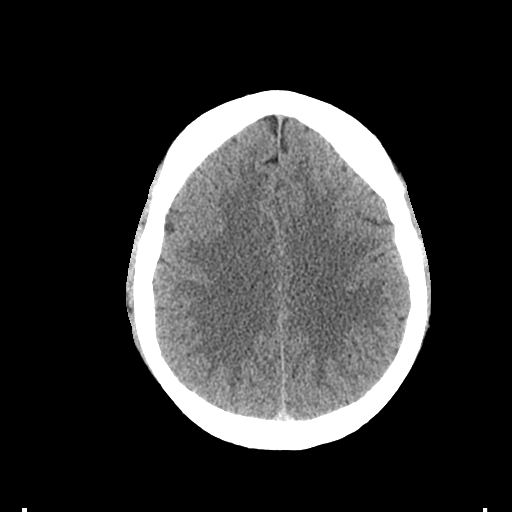
[im 26/33  brain]
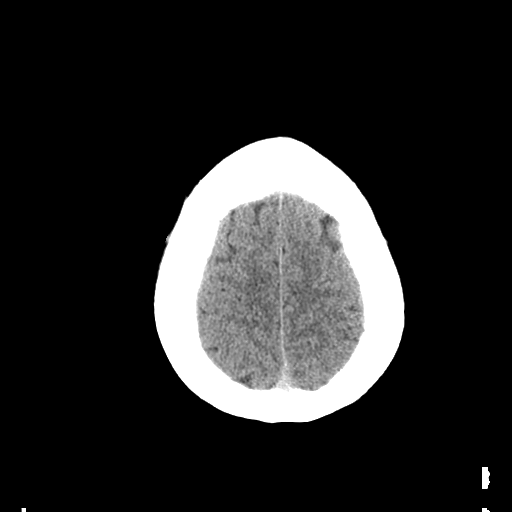
[im 30/33  brain]
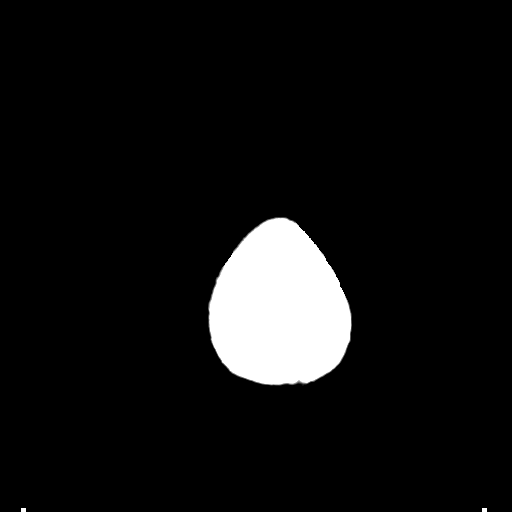

[Series 5: coronal · coronal · 0.31mm/px · 3 of 84 slices shown]
[im 28/84  brain]
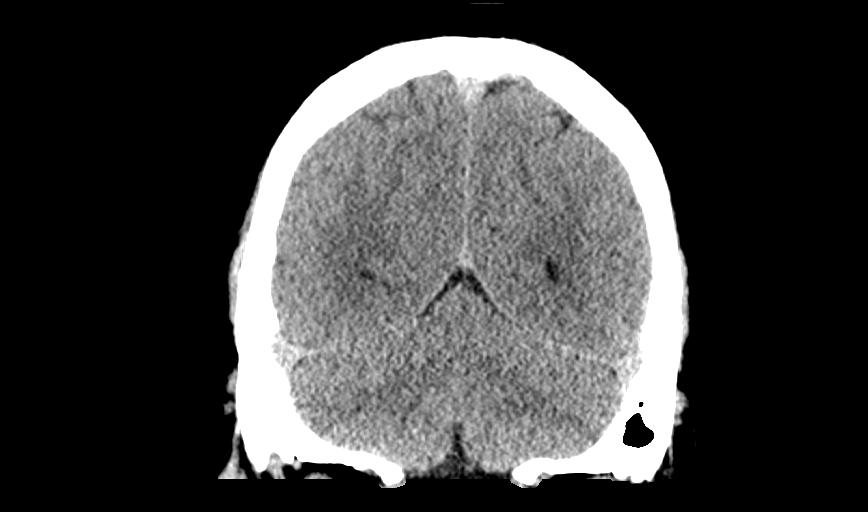
[im 37/84  brain]
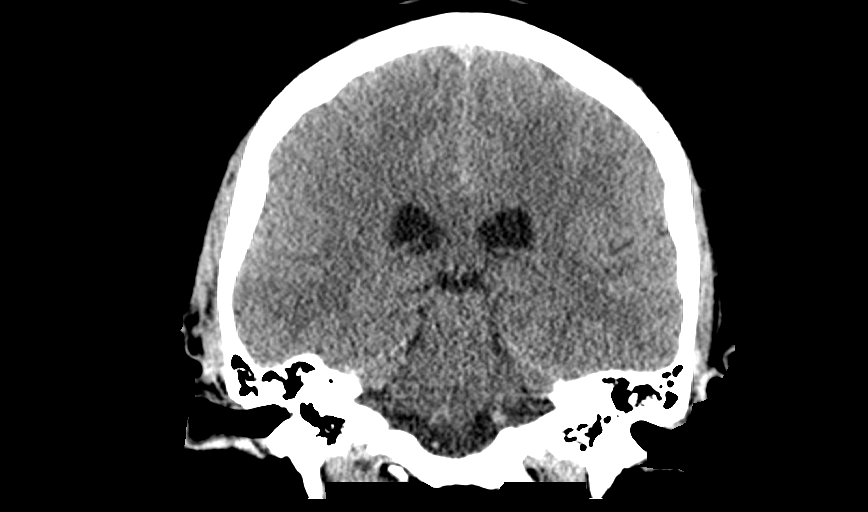
[im 47/84  brain]
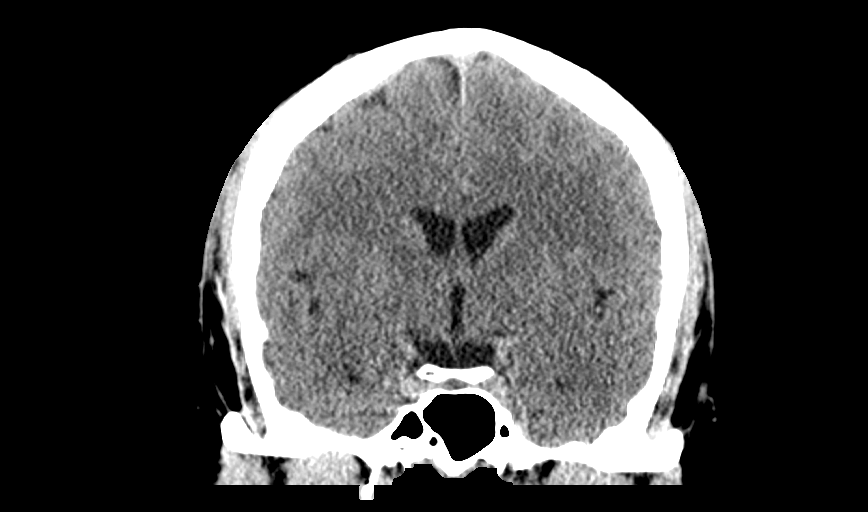

[Series 6: sagittal · sagittal · 0.31mm/px · 3 of 68 slices shown]
[im 23/68  brain]
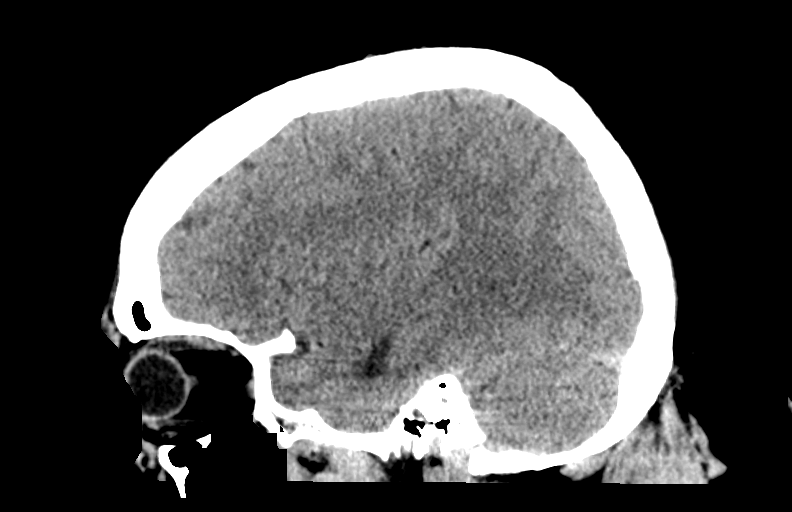
[im 34/68  brain]
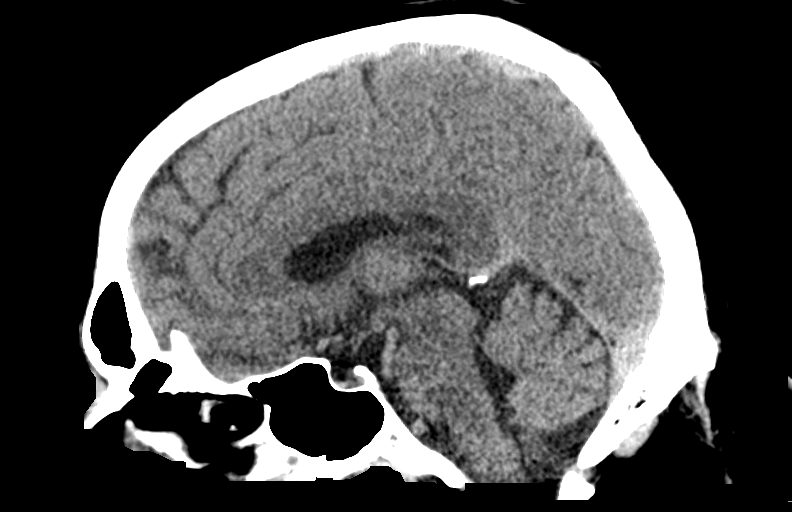
[im 45/68  brain]
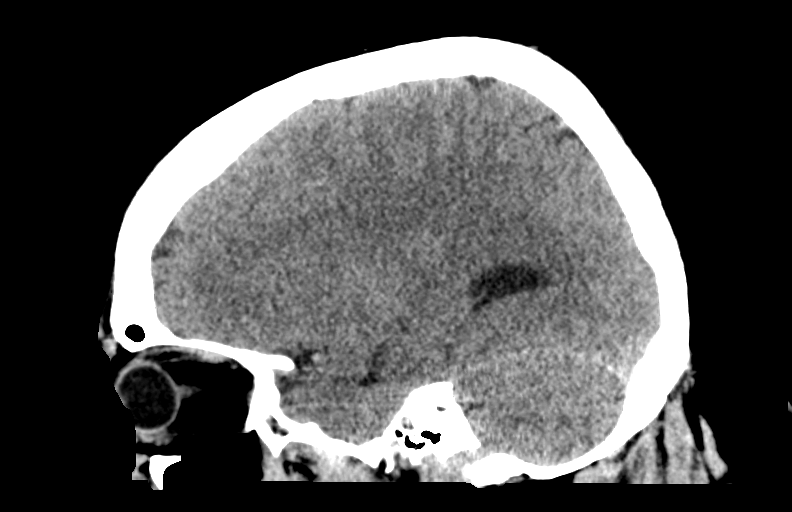

[14 of 47 positions shown; findings below may reference images not displayed]

FINDINGS: Brain: No evidence for acute hemorrhage, mass lesion, midline shift,
hydrocephalus or large infarct.

Vascular: No hyperdense vessel or unexpected calcification.

Skull: Normal. Negative for fracture or focal lesion.

Sinuses/Orbits: No acute finding.

Other: None.
IMPRESSION: No acute intracranial abnormality.
